# Patient Record
Sex: Female | Born: 1970 | Race: Asian | Hispanic: No | Marital: Married | Smoking: Never smoker
Health system: Southern US, Community
[De-identification: ages and names within clinical notes are randomized; demographics above are authoritative.]

---

## 2006-05-04 ENCOUNTER — Emergency Department: Payer: Self-pay | Admitting: Emergency Medicine

## 2006-10-13 ENCOUNTER — Ambulatory Visit: Payer: Self-pay | Admitting: Internal Medicine

## 2006-11-17 ENCOUNTER — Ambulatory Visit: Payer: Self-pay

## 2007-12-26 ENCOUNTER — Emergency Department: Payer: Self-pay | Admitting: Emergency Medicine

## 2008-01-12 ENCOUNTER — Emergency Department: Payer: Self-pay | Admitting: Emergency Medicine

## 2008-02-21 ENCOUNTER — Emergency Department: Payer: Self-pay | Admitting: Emergency Medicine

## 2008-02-26 ENCOUNTER — Ambulatory Visit: Payer: Self-pay | Admitting: Urology

## 2009-04-25 IMAGING — CT CT ABD-PELV W/O CM
1 of 2 series · 15 of 32 positions shown, 19 images · non-contrast
Comparison: none

REASON FOR EXAM: RLQ  and abdominal pain
COMMENTS:

[Series 2: soft tissue · axial · 0.73mm/px · z∈[-340,+74]mm · 15 of 156 slices shown, 19 images]
[im 12/156  soft-tissue]
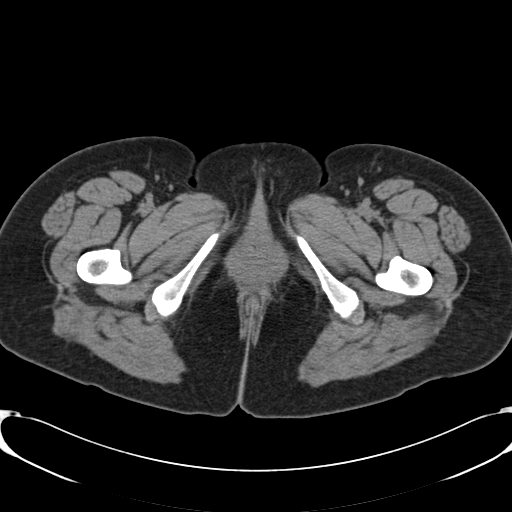
[im 12/156  bone]
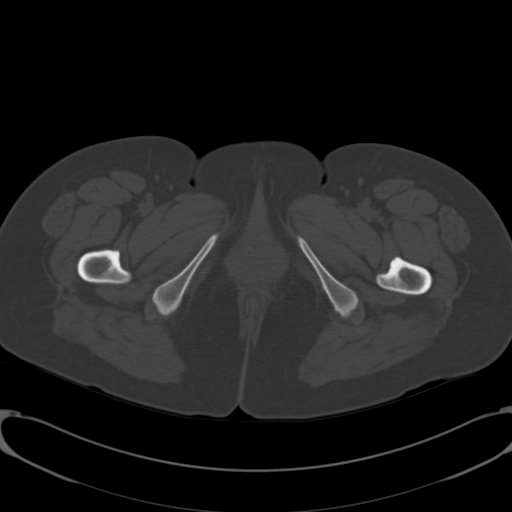
[im 23/156  soft-tissue]
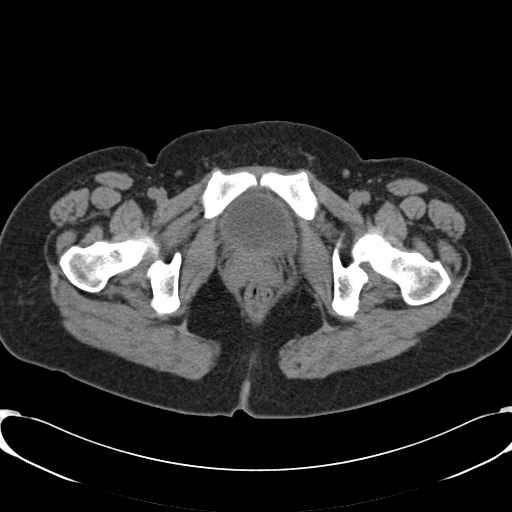
[im 34/156  soft-tissue]
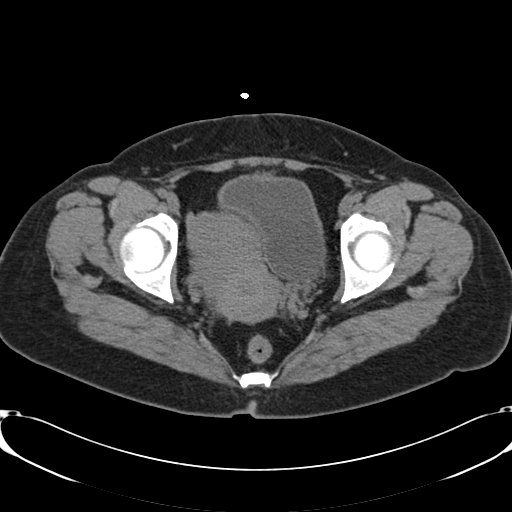
[im 45/156  soft-tissue]
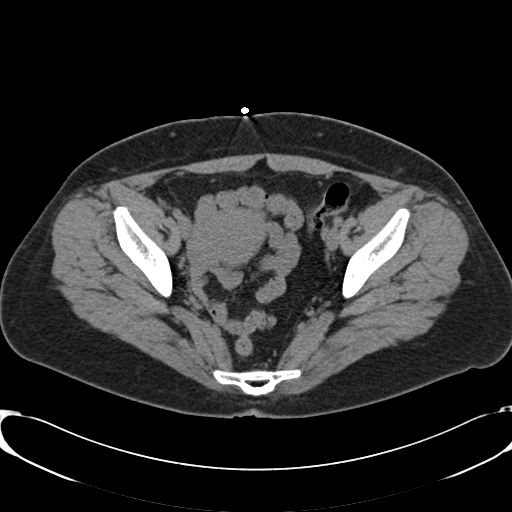
[im 56/156  soft-tissue]
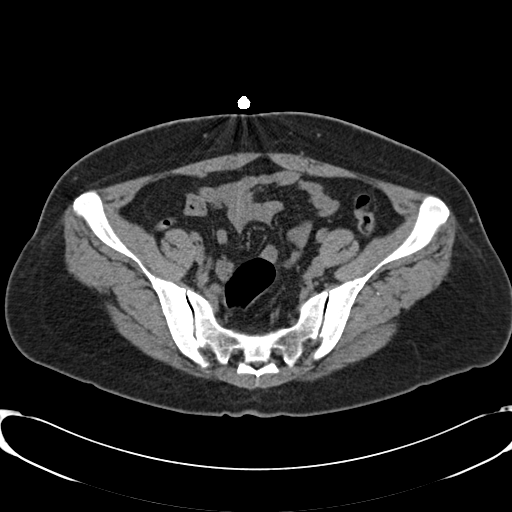
[im 67/156  soft-tissue]
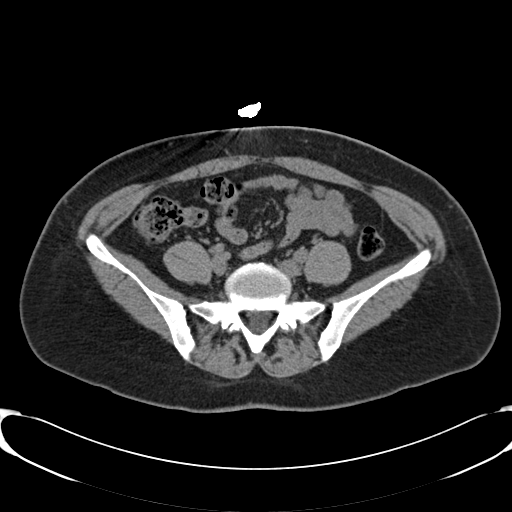
[im 78/156  soft-tissue]
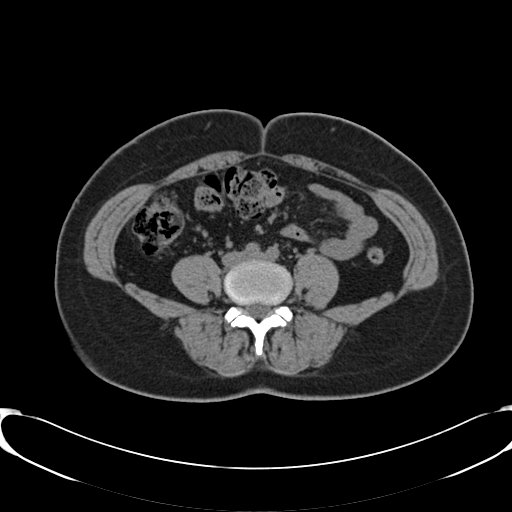
[im 89/156  soft-tissue]
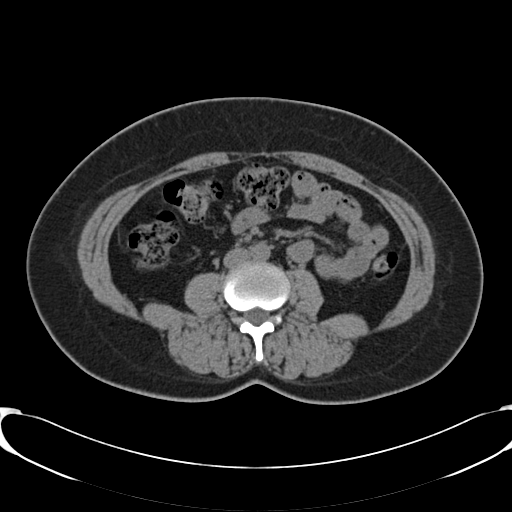
[im 100/156  soft-tissue]
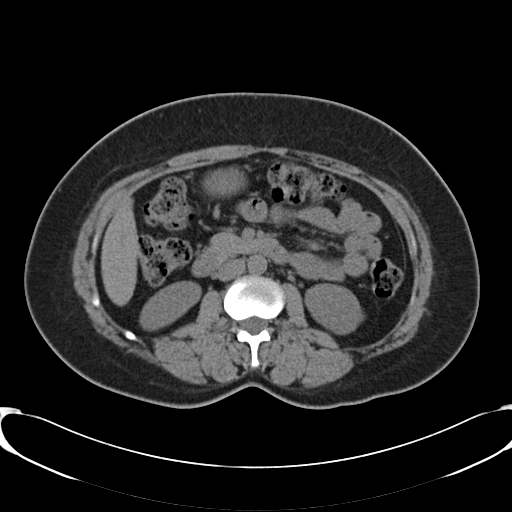
[im 100/156  bone]
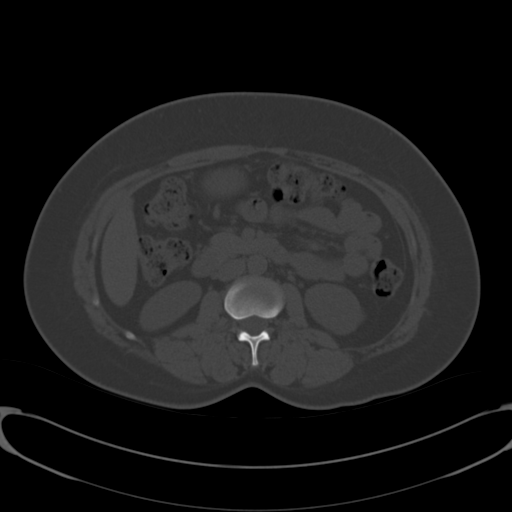
[im 111/156  soft-tissue]
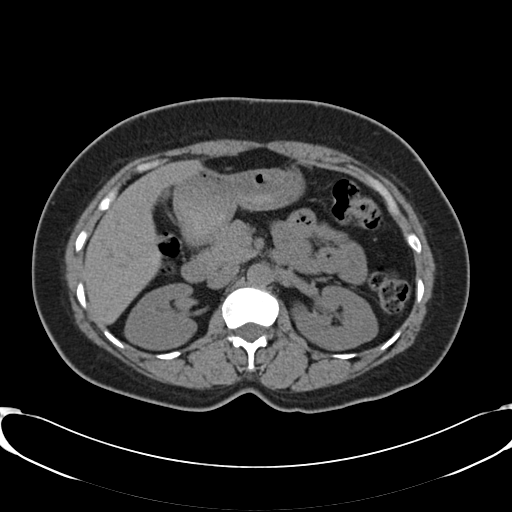
[im 122/156  soft-tissue]
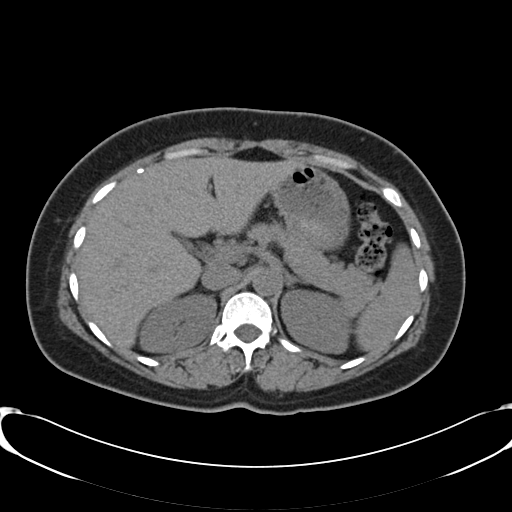
[im 133/156  soft-tissue]
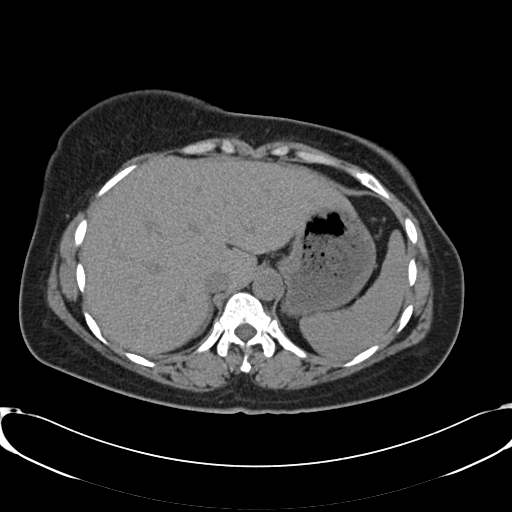
[im 133/156  lung]
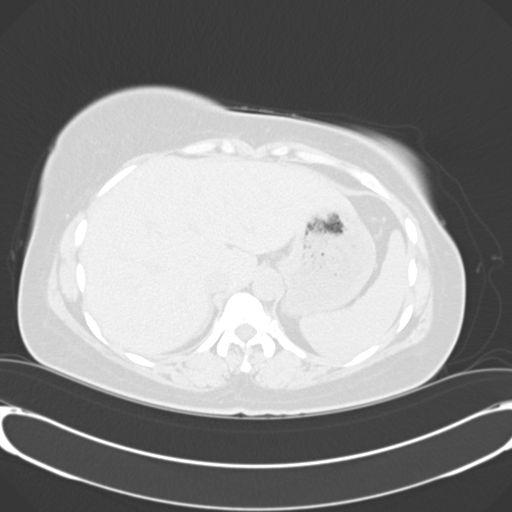
[im 139/156  lung]
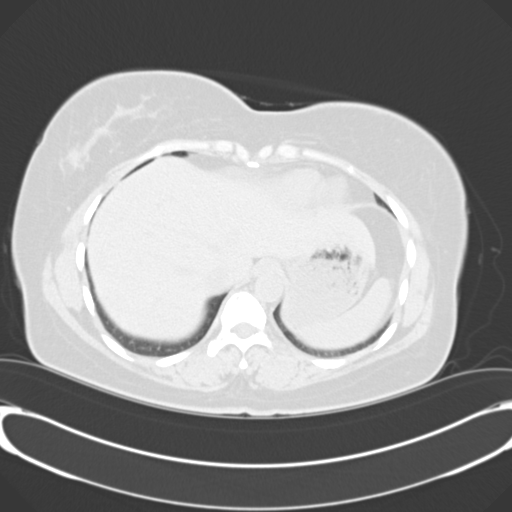
[im 144/156  soft-tissue]
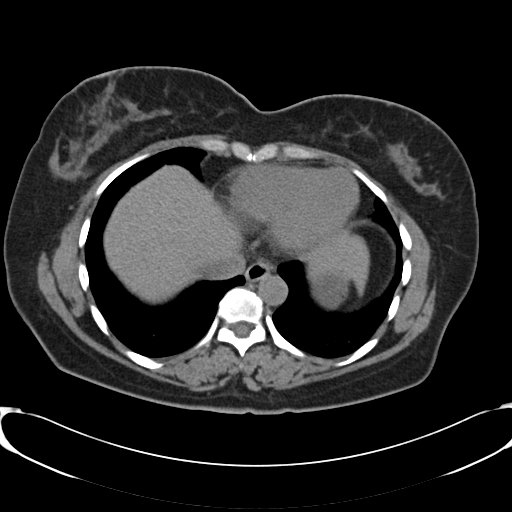
[im 144/156  lung]
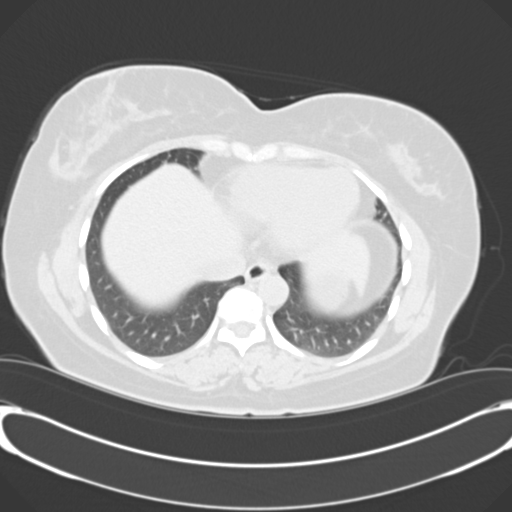
[im 150/156  lung]
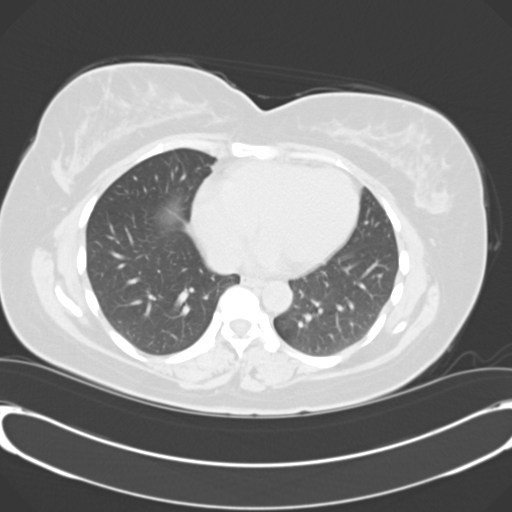

[15 of 32 positions shown; findings below may reference images not displayed]

PROCEDURE:     CT  - CT ABDOMEN AND PELVIS W[DATE]  [DATE]

RESULT:     Helical non-contrast 3 mm sections were obtained from the lung
bases through the pubic symphysis.

Evaluation of the lung bases demonstrates minimal hypoventilation.

Non-contrast evaluation of the liver, spleen, adrenals and pancreas are
unremarkable.

Evaluation of the RIGHT kidney demonstrates a 5.4 mm calcified density
within the medullary cortical junction region of the kidney. A second area
is identified within the medullary portion of the kidney measuring
approximately 2 to 3 mm. There is no evidence of hydronephrosis, hydroureter
or ureterolithiasis.  Evaluation of the LEFT kidney demonstrates no evidence
of hydronephrosis, masses or calculi. There is no evidence of abdominal or
pelvic free fluid, drainable loculated fluid collections, masses or
adenopathy.
IMPRESSION: 1.Non-obstructing calculi involving the RIGHT kidney without evidence of
hydronephrosis nor masses. Note not mentioned above there is mass-like
enlargement of the fundus of the uterus statistically likely representing a
leiomyoma.
2.There is no CT evidence of bowel obstruction, diverticulitis, colitis nor
evidence reflecting the sequela of appendicitis.

## 2019-05-06 ENCOUNTER — Encounter: Payer: Self-pay | Admitting: Emergency Medicine

## 2019-05-06 ENCOUNTER — Emergency Department
Admission: EM | Admit: 2019-05-06 | Discharge: 2019-05-06 | Disposition: A | Discharge status: Home / Self Care | Payer: HRSA Program | Attending: Emergency Medicine | Admitting: Emergency Medicine

## 2019-05-06 ENCOUNTER — Other Ambulatory Visit: Payer: Self-pay

## 2019-05-06 ENCOUNTER — Emergency Department: Payer: HRSA Program

## 2019-05-06 DIAGNOSIS — R05 Cough: Secondary | ICD-10-CM | POA: Diagnosis present

## 2019-05-06 DIAGNOSIS — R059 Cough, unspecified: Secondary | ICD-10-CM

## 2019-05-06 DIAGNOSIS — U071 COVID-19: Secondary | ICD-10-CM | POA: Diagnosis not present

## 2019-05-06 MED ORDER — AZITHROMYCIN 200 MG/5ML PO SUSR: ORAL | 0 refills | Status: AC

## 2019-05-06 MED ORDER — AZITHROMYCIN 250 MG PO TABS: ORAL_TABLET | ORAL | 0 refills | Status: DC

## 2019-05-06 NOTE — ED Provider Notes (Signed)
Emergency Department Provider Note  ____________________________________________  Time seen: Approximately 10:51 PM  I have reviewed the triage vital signs and the nursing notes.   HISTORY  Chief Complaint Cough (3 days ) and Fever   Historian Patient and daughter    HPI Patricia Velasquez is a 49 y.o. female presents to the emergency department with dry, nonproductive cough, sore throat, chills and headache for the past 2 to 3 days.  No recent travel.  Patient has had some mild shortness of breath and states that she has some anterior chest wall discomfort with coughing but not at rest.  No abdominal pain, vomiting or diarrhea.  Patient is accompanied by a family member who helps facilitate history taking as patient speaks [place] and there is not an Engineer, manufacturing systems.  No other alleviating measures have been attempted.    History reviewed. No pertinent past medical history.   Immunizations up to date:  Yes.     History reviewed. No pertinent past medical history.  There are no problems to display for this patient.   History reviewed. No pertinent surgical history.  Prior to Admission medications   Medication Sig Start Date End Date Taking? Authorizing Provider  azithromycin (ZITHROMAX) 200 MG/5ML suspension Take 13 mL by mouth on the first day. Take 6.5 mLs by mouth on days 2-5. 05/06/19   Orvil Feil, PA-C    Allergies Patient has no allergy information on record.  No family history on file.  Social History Social History   Tobacco Use  . Smoking status: Never Smoker  Substance Use Topics  . Alcohol use: Not Currently  . Drug use: Never      Review of Systems  Constitutional: Patient has fever.  Eyes: No visual changes. No discharge ENT: Patient has congestion.  Cardiovascular: no chest pain. Respiratory: Patient has cough.  Gastrointestinal: No abdominal pain. No diarrhea or emesis.  Genitourinary: Negative for dysuria. No  hematuria Musculoskeletal: Patient has myalgias.  Skin: Negative for rash, abrasions, lacerations, ecchymosis. Neurological: Patient has headache, no focal weakness or numbness.      ____________________________________________   PHYSICAL EXAM:  VITAL SIGNS: ED Triage Vitals  Enc Vitals Group     BP 05/06/19 2044 138/79     Pulse Rate 05/06/19 2044 89     Resp 05/06/19 2044 20     Temp 05/06/19 2044 98.4 F (36.9 C)     Temp Source 05/06/19 2044 Oral     SpO2 --      Weight 05/06/19 2045 145 lb (65.8 kg)     Height 05/06/19 2045 5\' 2"  (1.575 m)     Head Circumference --      Peak Flow --      Pain Score 05/06/19 2044 8     Pain Loc --      Pain Edu? --      Excl. in GC? --      Constitutional: Alert and oriented. Patient is lying supine. Eyes: Conjunctivae are normal. PERRL. EOMI. Head: Atraumatic. ENT:      Ears: Tympanic membranes are mildly injected with mild effusion bilaterally.       Nose: No congestion/rhinnorhea.      Mouth/Throat: Mucous membranes are moist. Posterior pharynx is mildly erythematous.  Hematological/Lymphatic/Immunilogical: No cervical lymphadenopathy.  Cardiovascular: Normal rate, regular rhythm. Normal S1 and S2.  Good peripheral circulation. Respiratory: Normal respiratory effort without tachypnea or retractions. Lungs CTAB. Good air entry to the bases with no decreased or absent breath sounds.  Gastrointestinal: Bowel sounds 4 quadrants. Soft and nontender to palpation. No guarding or rigidity. No palpable masses. No distention. No CVA tenderness. Musculoskeletal: Full range of motion to all extremities. No gross deformities appreciated. Neurologic:  Normal speech and language. No gross focal neurologic deficits are appreciated.  Skin:  Skin is warm, dry and intact. No rash noted. Psychiatric: Mood and affect are normal. Speech and behavior are normal. Patient exhibits appropriate insight and  judgement.    ____________________________________________   LABS (all labs ordered are listed, but only abnormal results are displayed)  Labs Reviewed  SARS CORONAVIRUS 2 (TAT 6-24 HRS)   ____________________________________________  EKG   ____________________________________________  RADIOLOGY Unk Pinto, personally viewed and evaluated these images (plain radiographs) as part of my medical decision making, as well as reviewing the written report by the radiologist.  DG Chest 1 View  Result Date: 05/06/2019 CLINICAL DATA:  Cough EXAM: CHEST  1 VIEW COMPARISON:  None. FINDINGS: There is a left lower lobe airspace opacity. There is no pneumothorax or significant pleural effusion. The heart size is normal. There is no acute osseous abnormality. IMPRESSION: Left lower lobe airspace disease compatible with pneumonia. Radiographic followup to resolution recommended. Electronically Signed   By: Constance Holster M.D.   On: 05/06/2019 22:19    ____________________________________________    PROCEDURES  Procedure(s) performed:     Procedures     Medications - No data to display   ____________________________________________   INITIAL IMPRESSION / ASSESSMENT AND PLAN / ED COURSE  Pertinent labs & imaging results that were available during my care of the patient were reviewed by me and considered in my medical decision making (see chart for details).    Assessment and plan Pneumonia 49 year old female presents to the emergency department with dry cough, chills and pharyngitis for the past 2 to 3 days.  Differential diagnosis includes COVID-19, community-acquired pneumonia and unspecified viral URI.  COVID-19 testing is pending at this time.  Chest x-ray reveals a left lower lobe opacity concerning for pneumonia.  Highly suspicious for COVID-19 at this time but patient was empirically treated with azithromycin at discharge.  Rest and hydration were encouraged  at home.  A work note was provided.  Tylenol and ibuprofen were recommended for fever.  Strict return precautions were given to return with new or worsening symptoms.  All patient questions were answered.     ____________________________________________  FINAL CLINICAL IMPRESSION(S) / ED DIAGNOSES  Final diagnoses:  Cough      NEW MEDICATIONS STARTED DURING THIS VISIT:  ED Discharge Orders         Ordered    azithromycin (ZITHROMAX Z-PAK) 250 MG tablet  Status:  Discontinued     05/06/19 2231    azithromycin (ZITHROMAX) 200 MG/5ML suspension     05/06/19 2242              This chart was dictated using voice recognition software/Dragon. Despite best efforts to proofread, errors can occur which can change the meaning. Any change was purely unintentional.     Lannie Fields, PA-C 05/06/19 Joycie Peek, MD 05/06/19 [phone]

## 2019-05-06 NOTE — Discharge Instructions (Signed)
Stay quarantined at home until COVID-19 results return. Take Azithromycin as directed on prescription.

## 2019-05-06 NOTE — ED Triage Notes (Signed)
Pt presents to ER from home with complaints of cough, sore throat, has had chills at home has not checked her temperature. Pt denies any other symptom. Pt speaks [place] husband with pt helping with information. Interpreter not available.

## 2019-05-07 LAB — SARS CORONAVIRUS 2 (TAT 6-24 HRS): SARS Coronavirus 2: POSITIVE — AB

## 2019-05-08 ENCOUNTER — Telehealth: Payer: Self-pay | Admitting: Emergency Medicine

## 2019-05-08 NOTE — Telephone Encounter (Signed)
Called patient and informed of positive covid and cdc guidelines for isolatoin and quarantine.  Used interpretter number [id number]  Patient is asking for copy of result for her employer.  She gave me her daughter's email so I can get it to her.  [name] Daughter was on the line with [place].

## 2021-11-16 IMAGING — DX DG CHEST 1V
1 series · 1 of 1 positions shown · non-contrast
Comparison: None.

CLINICAL DATA: Cough

EXAM:
CHEST  1 VIEW

[chest ap]
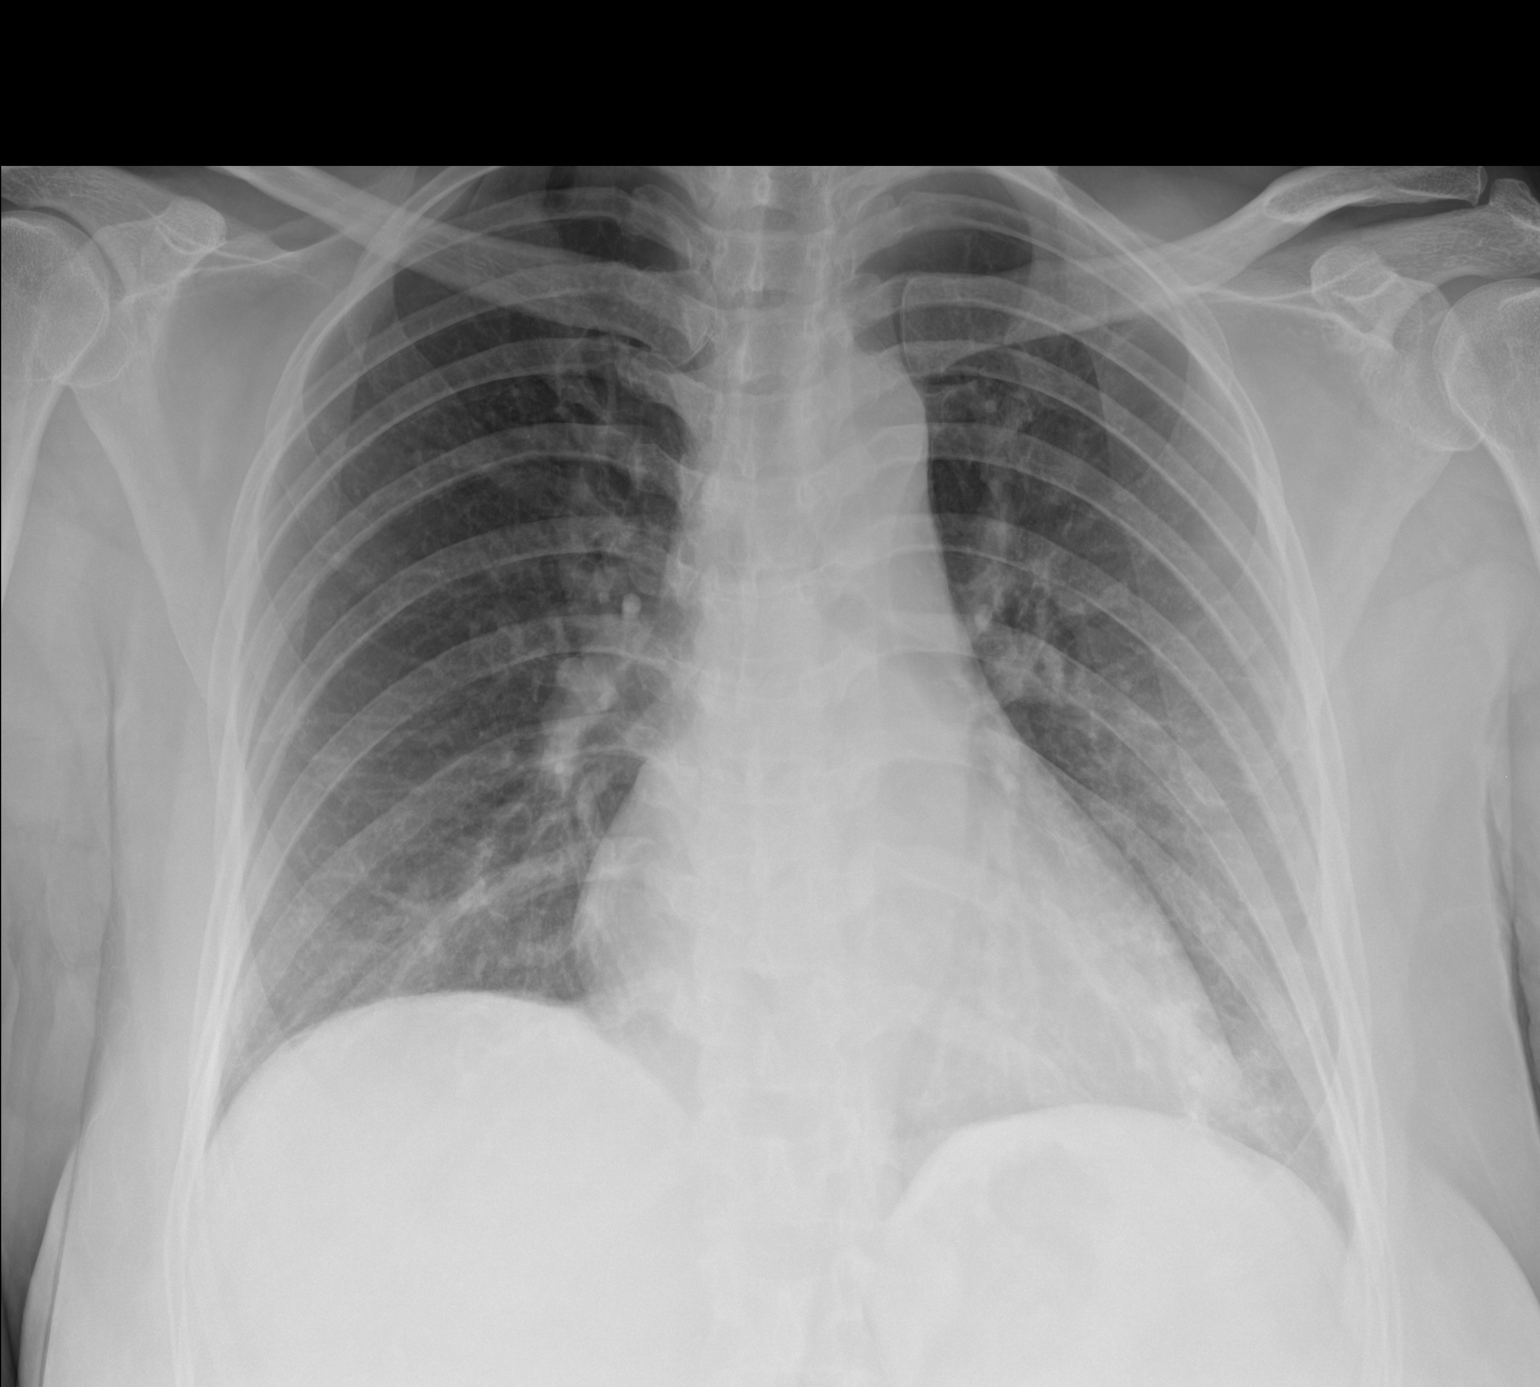

[1 of 1 positions shown; findings below may reference images not displayed]

FINDINGS: There is a left lower lobe airspace opacity. There is no
pneumothorax or significant pleural effusion. The heart size is
normal. There is no acute osseous abnormality.
IMPRESSION: Left lower lobe airspace disease compatible with pneumonia.
Radiographic followup to resolution recommended.
